# Patient Record
Sex: Female | Born: 1945 | Race: White | Hispanic: No | Marital: Married | Smoking: Former smoker
Health system: Southern US, Community
[De-identification: ages and names within clinical notes are randomized; demographics above are authoritative.]

## PROBLEM LIST (undated history)

## (undated) DIAGNOSIS — F419 Anxiety disorder, unspecified: Secondary | ICD-10-CM

## (undated) DIAGNOSIS — H1010 Acute atopic conjunctivitis, unspecified eye: Secondary | ICD-10-CM

## (undated) DIAGNOSIS — L309 Dermatitis, unspecified: Secondary | ICD-10-CM

## (undated) DIAGNOSIS — D473 Essential (hemorrhagic) thrombocythemia: Secondary | ICD-10-CM

## (undated) HISTORY — DX: Anxiety disorder, unspecified: F41.9

## (undated) HISTORY — DX: Essential (hemorrhagic) thrombocythemia: D47.3

## (undated) HISTORY — DX: Acute atopic conjunctivitis, unspecified eye: H10.10

## (undated) HISTORY — DX: Dermatitis, unspecified: L30.9

---
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
## 2014-11-17 DIAGNOSIS — D473 Essential (hemorrhagic) thrombocythemia: Secondary | ICD-10-CM | POA: Diagnosis not present

## 2014-12-15 DIAGNOSIS — H04123 Dry eye syndrome of bilateral lacrimal glands: Secondary | ICD-10-CM | POA: Diagnosis not present

## 2015-01-03 DIAGNOSIS — F329 Major depressive disorder, single episode, unspecified: Secondary | ICD-10-CM | POA: Diagnosis not present

## 2015-01-03 DIAGNOSIS — Z0000 Encounter for general adult medical examination without abnormal findings: Secondary | ICD-10-CM | POA: Diagnosis not present

## 2015-01-03 DIAGNOSIS — Z23 Encounter for immunization: Secondary | ICD-10-CM | POA: Diagnosis not present

## 2015-01-03 DIAGNOSIS — Z79890 Hormone replacement therapy (postmenopausal): Secondary | ICD-10-CM | POA: Diagnosis not present

## 2015-01-05 DIAGNOSIS — I83811 Varicose veins of right lower extremities with pain: Secondary | ICD-10-CM | POA: Diagnosis not present

## 2015-01-06 DIAGNOSIS — I83812 Varicose veins of left lower extremities with pain: Secondary | ICD-10-CM | POA: Diagnosis not present

## 2015-01-12 DIAGNOSIS — D473 Essential (hemorrhagic) thrombocythemia: Secondary | ICD-10-CM | POA: Diagnosis not present

## 2015-01-25 DIAGNOSIS — Z2889 Immunization not carried out for other reason: Secondary | ICD-10-CM | POA: Diagnosis not present

## 2015-01-25 DIAGNOSIS — L820 Inflamed seborrheic keratosis: Secondary | ICD-10-CM | POA: Diagnosis not present

## 2015-01-25 DIAGNOSIS — D225 Melanocytic nevi of trunk: Secondary | ICD-10-CM | POA: Diagnosis not present

## 2015-02-03 DIAGNOSIS — Z1231 Encounter for screening mammogram for malignant neoplasm of breast: Secondary | ICD-10-CM | POA: Diagnosis not present

## 2015-02-08 DIAGNOSIS — L281 Prurigo nodularis: Secondary | ICD-10-CM | POA: Diagnosis not present

## 2015-02-08 DIAGNOSIS — D485 Neoplasm of uncertain behavior of skin: Secondary | ICD-10-CM | POA: Diagnosis not present

## 2015-02-08 DIAGNOSIS — L859 Epidermal thickening, unspecified: Secondary | ICD-10-CM | POA: Diagnosis not present

## 2015-02-08 DIAGNOSIS — D225 Melanocytic nevi of trunk: Secondary | ICD-10-CM | POA: Diagnosis not present

## 2015-02-24 DIAGNOSIS — L570 Actinic keratosis: Secondary | ICD-10-CM | POA: Diagnosis not present

## 2015-02-24 DIAGNOSIS — D485 Neoplasm of uncertain behavior of skin: Secondary | ICD-10-CM | POA: Diagnosis not present

## 2015-02-24 DIAGNOSIS — D2271 Melanocytic nevi of right lower limb, including hip: Secondary | ICD-10-CM | POA: Diagnosis not present

## 2015-02-24 DIAGNOSIS — L821 Other seborrheic keratosis: Secondary | ICD-10-CM | POA: Diagnosis not present

## 2015-06-06 DIAGNOSIS — I83891 Varicose veins of right lower extremities with other complications: Secondary | ICD-10-CM | POA: Diagnosis not present

## 2015-06-06 DIAGNOSIS — I83892 Varicose veins of left lower extremities with other complications: Secondary | ICD-10-CM | POA: Diagnosis not present

## 2015-06-07 DIAGNOSIS — I83891 Varicose veins of right lower extremities with other complications: Secondary | ICD-10-CM | POA: Diagnosis not present

## 2015-06-07 DIAGNOSIS — I83892 Varicose veins of left lower extremities with other complications: Secondary | ICD-10-CM | POA: Diagnosis not present

## 2015-06-08 DIAGNOSIS — Z23 Encounter for immunization: Secondary | ICD-10-CM | POA: Diagnosis not present

## 2015-06-27 DIAGNOSIS — D473 Essential (hemorrhagic) thrombocythemia: Secondary | ICD-10-CM | POA: Diagnosis not present

## 2015-07-07 DIAGNOSIS — H10413 Chronic giant papillary conjunctivitis, bilateral: Secondary | ICD-10-CM | POA: Diagnosis not present

## 2015-07-26 DIAGNOSIS — Z780 Asymptomatic menopausal state: Secondary | ICD-10-CM | POA: Diagnosis not present

## 2015-07-26 DIAGNOSIS — K635 Polyp of colon: Secondary | ICD-10-CM | POA: Diagnosis not present

## 2015-07-26 DIAGNOSIS — D473 Essential (hemorrhagic) thrombocythemia: Secondary | ICD-10-CM | POA: Diagnosis not present

## 2015-09-27 DIAGNOSIS — R197 Diarrhea, unspecified: Secondary | ICD-10-CM | POA: Diagnosis not present

## 2015-09-27 DIAGNOSIS — R10814 Left lower quadrant abdominal tenderness: Secondary | ICD-10-CM | POA: Diagnosis not present

## 2015-11-18 DIAGNOSIS — H1033 Unspecified acute conjunctivitis, bilateral: Secondary | ICD-10-CM | POA: Diagnosis not present

## 2015-11-18 DIAGNOSIS — H2513 Age-related nuclear cataract, bilateral: Secondary | ICD-10-CM | POA: Diagnosis not present

## 2015-12-06 DIAGNOSIS — L570 Actinic keratosis: Secondary | ICD-10-CM | POA: Diagnosis not present

## 2015-12-06 DIAGNOSIS — H01111 Allergic dermatitis of right upper eyelid: Secondary | ICD-10-CM | POA: Diagnosis not present

## 2015-12-06 DIAGNOSIS — D485 Neoplasm of uncertain behavior of skin: Secondary | ICD-10-CM | POA: Diagnosis not present

## 2015-12-06 DIAGNOSIS — L821 Other seborrheic keratosis: Secondary | ICD-10-CM | POA: Diagnosis not present

## 2015-12-06 DIAGNOSIS — L820 Inflamed seborrheic keratosis: Secondary | ICD-10-CM | POA: Diagnosis not present

## 2015-12-06 DIAGNOSIS — L814 Other melanin hyperpigmentation: Secondary | ICD-10-CM | POA: Diagnosis not present

## 2015-12-06 DIAGNOSIS — D225 Melanocytic nevi of trunk: Secondary | ICD-10-CM | POA: Diagnosis not present

## 2015-12-07 DIAGNOSIS — H1033 Unspecified acute conjunctivitis, bilateral: Secondary | ICD-10-CM | POA: Diagnosis not present

## 2015-12-27 DIAGNOSIS — Z882 Allergy status to sulfonamides status: Secondary | ICD-10-CM | POA: Diagnosis not present

## 2015-12-27 DIAGNOSIS — D473 Essential (hemorrhagic) thrombocythemia: Secondary | ICD-10-CM | POA: Diagnosis not present

## 2015-12-27 DIAGNOSIS — Z79899 Other long term (current) drug therapy: Secondary | ICD-10-CM | POA: Diagnosis not present

## 2015-12-27 DIAGNOSIS — I878 Other specified disorders of veins: Secondary | ICD-10-CM | POA: Diagnosis not present

## 2015-12-27 DIAGNOSIS — I8390 Asymptomatic varicose veins of unspecified lower extremity: Secondary | ICD-10-CM | POA: Diagnosis not present

## 2015-12-27 DIAGNOSIS — Z7982 Long term (current) use of aspirin: Secondary | ICD-10-CM | POA: Diagnosis not present

## 2015-12-27 DIAGNOSIS — Z9289 Personal history of other medical treatment: Secondary | ICD-10-CM | POA: Diagnosis not present

## 2016-01-12 DIAGNOSIS — R748 Abnormal levels of other serum enzymes: Secondary | ICD-10-CM | POA: Diagnosis not present

## 2016-04-17 DIAGNOSIS — Z1231 Encounter for screening mammogram for malignant neoplasm of breast: Secondary | ICD-10-CM | POA: Diagnosis not present

## 2016-05-11 DIAGNOSIS — J069 Acute upper respiratory infection, unspecified: Secondary | ICD-10-CM | POA: Diagnosis not present

## 2016-05-15 DIAGNOSIS — F5101 Primary insomnia: Secondary | ICD-10-CM | POA: Diagnosis not present

## 2016-05-15 DIAGNOSIS — R41840 Attention and concentration deficit: Secondary | ICD-10-CM | POA: Diagnosis not present

## 2016-05-15 DIAGNOSIS — K635 Polyp of colon: Secondary | ICD-10-CM | POA: Diagnosis not present

## 2016-05-15 DIAGNOSIS — Z136 Encounter for screening for cardiovascular disorders: Secondary | ICD-10-CM | POA: Diagnosis not present

## 2016-05-15 DIAGNOSIS — Z780 Asymptomatic menopausal state: Secondary | ICD-10-CM | POA: Diagnosis not present

## 2016-05-15 DIAGNOSIS — Z1159 Encounter for screening for other viral diseases: Secondary | ICD-10-CM | POA: Diagnosis not present

## 2016-05-15 DIAGNOSIS — Z0000 Encounter for general adult medical examination without abnormal findings: Secondary | ICD-10-CM | POA: Diagnosis not present

## 2016-05-15 DIAGNOSIS — Z131 Encounter for screening for diabetes mellitus: Secondary | ICD-10-CM | POA: Diagnosis not present

## 2016-05-15 DIAGNOSIS — E663 Overweight: Secondary | ICD-10-CM | POA: Diagnosis not present

## 2016-05-15 DIAGNOSIS — D473 Essential (hemorrhagic) thrombocythemia: Secondary | ICD-10-CM | POA: Diagnosis not present

## 2016-06-04 DIAGNOSIS — F419 Anxiety disorder, unspecified: Secondary | ICD-10-CM | POA: Diagnosis not present

## 2016-06-04 DIAGNOSIS — Z23 Encounter for immunization: Secondary | ICD-10-CM | POA: Diagnosis not present

## 2016-06-04 DIAGNOSIS — R05 Cough: Secondary | ICD-10-CM | POA: Diagnosis not present

## 2016-06-05 DIAGNOSIS — L814 Other melanin hyperpigmentation: Secondary | ICD-10-CM | POA: Diagnosis not present

## 2016-06-05 DIAGNOSIS — L570 Actinic keratosis: Secondary | ICD-10-CM | POA: Diagnosis not present

## 2016-06-05 DIAGNOSIS — D1801 Hemangioma of skin and subcutaneous tissue: Secondary | ICD-10-CM | POA: Diagnosis not present

## 2016-06-05 DIAGNOSIS — L821 Other seborrheic keratosis: Secondary | ICD-10-CM | POA: Diagnosis not present

## 2016-06-05 DIAGNOSIS — D485 Neoplasm of uncertain behavior of skin: Secondary | ICD-10-CM | POA: Diagnosis not present

## 2016-06-05 DIAGNOSIS — L565 Disseminated superficial actinic porokeratosis (DSAP): Secondary | ICD-10-CM | POA: Diagnosis not present

## 2016-06-05 DIAGNOSIS — D2261 Melanocytic nevi of right upper limb, including shoulder: Secondary | ICD-10-CM | POA: Diagnosis not present

## 2016-06-13 DIAGNOSIS — D473 Essential (hemorrhagic) thrombocythemia: Secondary | ICD-10-CM | POA: Diagnosis not present

## 2016-07-17 DIAGNOSIS — R05 Cough: Secondary | ICD-10-CM | POA: Diagnosis not present

## 2016-07-17 DIAGNOSIS — F411 Generalized anxiety disorder: Secondary | ICD-10-CM | POA: Diagnosis not present

## 2016-07-19 DIAGNOSIS — L570 Actinic keratosis: Secondary | ICD-10-CM | POA: Diagnosis not present

## 2016-08-23 ENCOUNTER — Encounter: Payer: Self-pay | Admitting: Allergy and Immunology

## 2016-08-23 ENCOUNTER — Ambulatory Visit (INDEPENDENT_AMBULATORY_CARE_PROVIDER_SITE_OTHER): Payer: Medicare Other | Admitting: Allergy and Immunology

## 2016-08-23 VITALS — BP 166/82 | HR 96 | Temp 98.4°F | Resp 16 | Ht 66.0 in | Wt 161.2 lb

## 2016-08-23 DIAGNOSIS — R05 Cough: Secondary | ICD-10-CM

## 2016-08-23 DIAGNOSIS — R053 Chronic cough: Secondary | ICD-10-CM | POA: Insufficient documentation

## 2016-08-23 DIAGNOSIS — R030 Elevated blood-pressure reading, without diagnosis of hypertension: Secondary | ICD-10-CM | POA: Diagnosis not present

## 2016-08-23 DIAGNOSIS — H1010 Acute atopic conjunctivitis, unspecified eye: Secondary | ICD-10-CM | POA: Insufficient documentation

## 2016-08-23 DIAGNOSIS — H1013 Acute atopic conjunctivitis, bilateral: Secondary | ICD-10-CM

## 2016-08-23 DIAGNOSIS — J3089 Other allergic rhinitis: Secondary | ICD-10-CM | POA: Diagnosis not present

## 2016-08-23 MED ORDER — AZELASTINE HCL 0.1 % NA SOLN: 1.0000 | Freq: Two times a day (BID) | NASAL | 5 refills | Status: DC | PRN

## 2016-08-23 MED ORDER — FLUTTER DEVI: 0 refills | Status: DC

## 2016-08-23 NOTE — Assessment & Plan Note (Deleted)
   The patient has been made aware of the elevated blood pressure reading and has been encouraged to follow up with her primary care physician in the near future regarding this issue.  Loretta Garrison has verbalized understanding and agreed to do so.

## 2016-08-23 NOTE — Progress Notes (Signed)
New Patient Note  RE: Loretta Garrison MRN: [id number] DOB: 09/23/1946 Date of Office Visit: 08/23/2016  Referring provider: No ref. provider found Primary care provider: Tenna Child, MD  Chief Complaint: Cough and Nasal Congestion   History of present illness: Loretta Garrison is a 70 y.o. female presenting today for evaluation of cough and rhinitis.  She reports that since August 2017 she has experienced nasal congestion, rhinorrhea, postnasal drainage, sneezing, ocular pruritus, maxillary sinus pressure, and coughing.   No significant seasonal symptom variation has been noted nor have specific environmental triggers been identified.She reports coughing has been persistent and frustrating stating that it is "so bad it which openly."  Pneumonia was ruled out and she failed a proton pump inhibitor trial to help rule out silent reflux.  She tried switching from cetirizine to loratadine without perceived benefit.   Assessment and plan: Cough, persistent The patient's history and physical examination suggest upper airway cough syndrome.  Spirometry today reveals normal ventilatory function. We will aggressively treat postnasal drainage and evaluate results.  A prescription has been provided for a flutter valve to be used as needed to break the coughing cycle.  Treatment plan as outlined below.  If the coughing persists or progresses despite this plan, we will evaluate further.  Perennial allergic rhinitis  Aeroallergen avoidance measures have been discussed and provided in written form.  A prescription has been provided for azelastine nasal spray, 1-2 sprays per nostril 2 times daily as needed. Proper nasal spray technique has been discussed and demonstrated.   Nasal saline lavage (NeilMed) has been recommended prior to medicated nasal sprays and as needed along with instructions for proper administration.  For thick post nasal drainage, add guaifenesin 1200 mg (Mucinex Maximum Strength)   twice daily as needed with adequate hydration as discussed.  Discontinue loratadine as this medication may contribute to mucus viscosity.  Allergic conjunctivitis  Treatment plan as outlined above for allergic rhinitis.  A prescription has been provided for Pataday, one drop per eye daily as needed.  Elevated blood-pressure reading without diagnosis of hypertension  The patient has been made aware of the elevated blood pressure reading and has been encouraged to follow up with her primary care physician in the near future regarding this issue.  Loretta Garrison has verbalized understanding and agreed to do so.   Meds ordered this encounter  Medications  . azelastine (ASTELIN) 0.1 % nasal spray    Sig: Place 1-2 sprays into both nostrils 2 (two) times daily as needed for rhinitis.    Dispense:  30 mL    Refill:  5  . Respiratory Therapy Supplies (FLUTTER) DEVI    Sig: Use as directed    Dispense:  1 each    Refill:  0    Diagnostics: Spirometry:  Normal with an FEV1 of 94% predicted.  Please see scanned spirometry results for details. Epicutaneous testing:  Negative despite a positive histamine control. Intradermal testing: Positive to molds and dust mite antigen.    Physical examination: Blood pressure (!) 166/82, pulse 96, temperature 98.4 F (36.9 C), temperature source Oral, resp. rate 16, height 5\' 6"  (1.676 m), weight 161 lb 3.2 oz (73.1 kg).  General: Alert, interactive, in no acute distress. HEENT: TMs pearly gray, turbinates moderately edematous with thick discharge, post-pharynx erythematous. Neck: Supple without lymphadenopathy. Lungs: Clear to auscultation without wheezing, rhonchi or rales. CV: Normal S1, S2 without murmurs. Abdomen: Nondistended, nontender. Skin: Warm and dry, without lesions or rashes. Extremities:  No clubbing,  cyanosis or edema. Neuro:   Grossly intact.  Review of systems:  Review of systems negative except as noted in HPI / PMHx or noted  below: Review of Systems  Constitutional: Negative.   HENT: Negative.   Eyes: Negative.   Respiratory: Negative.   Cardiovascular: Negative.   Gastrointestinal: Negative.   Genitourinary: Negative.   Musculoskeletal: Negative.   Skin: Negative.   Neurological: Negative.   Endo/Heme/Allergies: Negative.   Psychiatric/Behavioral: Negative.     Past medical history:  Past Medical History:  Diagnosis Date  . Allergic conjunctivitis   . Anxiety   . Eczema   . Essential thrombocythemia [facility])     Past surgical history:  Past Surgical History:  Procedure Laterality Date  . TONSILLECTOMY  1958    Family history: Family History  Problem Relation Age of Onset  . Eczema Mother   . Allergic rhinitis Father   . Sinusitis Father   . Eczema Sister   . Asthma Neg Hx   . Immunodeficiency Neg Hx   . Urticaria Neg Hx   . Atopy Neg Hx   . Angioedema Neg Hx     Social history: Social History   Social History  . Marital status: Married    Spouse name: N/A  . Number of children: N/A  . Years of education: N/A   Occupational History  . Not on file.   Social History Main Topics  . Smoking status: Former Smoker    Types: Cigarettes    Quit date: 08/23/1985  . Smokeless tobacco: Never Used  . Alcohol use 0.6 oz/week    1 Shots of liquor per week  . Drug use: No  . Sexual activity: Not on file   Other Topics Concern  . Not on file   Social History Narrative  . No narrative on file   Environmental History: The patient lives in a 68 year old townhouse with carpeting in the bedroom and central air/heat. There are no pets in the townhouse. She is a former cigarette smoker, having smoked from 19 to 1991 with a 12 pack year history.    Medication List       Accurate as of 08/23/16  1:00 PM. Always use your most recent med list.          ALPRAZolam 0.25 MG tablet Commonly known as:  XANAX TAKE 1 TABLET BY MOUTH AT BEDTIME AS NEEDED FOR INSOMNIA   anagrelide 0.5  MG capsule Commonly known as:  AGRYLIN Take by mouth.   azelastine 0.1 % nasal spray Commonly known as:  ASTELIN Place 1-2 sprays into both nostrils 2 (two) times daily as needed for rhinitis.   Biotin 10000 MCG Tabs Take by mouth.   busPIRone 15 MG tablet Commonly known as:  BUSPAR TAKE 1/2 TABLET BY MOUTH TWICE DAILY   CENTRUM SILVER 50+WOMEN PO Take by mouth.   estradiol 1 MG tablet Commonly known as:  ESTRACE Take by mouth.   FISH-FLAX-BORAGE PO Take 1,200 mg by mouth daily.   fluoruracil 1 % cream Commonly known as:  FLUOROPLEX Apply topically.   FLUTTER Devi Use as directed   GOODSENSE ASPIRIN 325 MG tablet Generic drug:  aspirin Take by mouth.   ibuprofen 200 MG tablet Commonly known as:  ADVIL,MOTRIN Take by mouth.   LOTEMAX 0.5 % ophthalmic suspension Generic drug:  loteprednol Apply to eye.   medroxyPROGESTERone 5 MG tablet Commonly known as:  PROVERA Take by mouth.   Olopatadine HCl 0.2 % Soln Administer 1  drop to both eyes daily as needed.   PROBIOTIC DAILY PO Take by mouth.   vitamin C 500 MG tablet Commonly known as:  ASCORBIC ACID Take 500 mg by mouth daily.       Known medication allergies: Allergies  Allergen Reactions  . Sulfa Antibiotics Hives    I appreciate the opportunity to take part in Loretta Garrison's care. Please do not hesitate to contact me with questions.  Sincerely,   R. Edgar Frisk, MD

## 2016-08-23 NOTE — Assessment & Plan Note (Signed)
   Aeroallergen avoidance measures have been discussed and provided in written form.  A prescription has been provided for azelastine nasal spray, 1-2 sprays per nostril 2 times daily as needed. Proper nasal spray technique has been discussed and demonstrated.   Nasal saline lavage (NeilMed) has been recommended prior to medicated nasal sprays and as needed along with instructions for proper administration.  For thick post nasal drainage, add guaifenesin 1200 mg (Mucinex Maximum Strength)  twice daily as needed with adequate hydration as discussed.  Discontinue loratadine as this medication may contribute to mucus viscosity.

## 2016-08-23 NOTE — Assessment & Plan Note (Signed)
   Treatment plan as outlined above for allergic rhinitis.  A prescription has been provided for Pataday, one drop per eye daily as needed. 

## 2016-08-23 NOTE — Patient Instructions (Addendum)
Cough, persistent The patient's history and physical examination suggest upper airway cough syndrome.  Spirometry today reveals normal ventilatory function. We will aggressively treat postnasal drainage and evaluate results.  A prescription has been provided for a flutter valve to be used as needed to break the coughing cycle.  Treatment plan as outlined below.  If the coughing persists or progresses despite this plan, we will evaluate further.  Perennial allergic rhinitis  Aeroallergen avoidance measures have been discussed and provided in written form.  A prescription has been provided for azelastine nasal spray, 1-2 sprays per nostril 2 times daily as needed. Proper nasal spray technique has been discussed and demonstrated.   Nasal saline lavage (NeilMed) has been recommended prior to medicated nasal sprays and as needed along with instructions for proper administration.  For thick post nasal drainage, add guaifenesin 1200 mg (Mucinex Maximum Strength)  twice daily as needed with adequate hydration as discussed.  Discontinue loratadine as this medication may contribute to mucus viscosity.  Allergic conjunctivitis  Treatment plan as outlined above for allergic rhinitis.  A prescription has been provided for Pataday, one drop per eye daily as needed.  Elevated blood-pressure reading without diagnosis of hypertension  The patient has been made aware of the elevated blood pressure reading and has been encouraged to follow up with her primary care physician in the near future regarding this issue.  Marirose has verbalized understanding and agreed to do so.   Return in about 6 weeks (around 10/04/2016), or if symptoms worsen or fail to improve.  Control of House Dust Mite Allergen  House dust mites play a major role in allergic asthma and rhinitis.  They occur in environments with high humidity wherever human skin, the food for dust mites is found. High levels have been detected in dust  obtained from mattresses, pillows, carpets, upholstered furniture, bed covers, clothes and soft toys.  The principal allergen of the house dust mite is found in its feces.  A gram of dust may contain 1,000 mites and 250,000 fecal particles.  Mite antigen is easily measured in the air during house cleaning activities.    1. Encase mattresses, including the box spring, and pillow, in an air tight cover.  Seal the zipper end of the encased mattresses with wide adhesive tape. 2. Wash the bedding in water of 130 degrees Farenheit weekly.  Avoid cotton comforters/quilts and flannel bedding: the most ideal bed covering is the dacron comforter. 3. Remove all upholstered furniture from the bedroom. 4. Remove carpets, carpet padding, rugs, and non-washable window drapes from the bedroom.  Wash drapes weekly or use plastic window coverings. 5. Remove all non-washable stuffed toys from the bedroom.  Wash stuffed toys weekly. 6. Have the room cleaned frequently with a vacuum cleaner and a damp dust-mop.  The patient should not be in a room which is being cleaned and should wait 1 hour after cleaning before going into the room. 7. Close and seal all heating outlets in the bedroom.  Otherwise, the room will become filled with dust-laden air.  An electric heater can be used to heat the room. 8. Reduce indoor humidity to less than 50%.  Do not use a humidifier.  Control of Mold Allergen  Mold and fungi can grow on a variety of surfaces provided certain temperature and moisture conditions exist.  Outdoor molds grow on plants, decaying vegetation and soil.  The major outdoor mold, Alternaria and Cladosporium, are found in very high numbers during hot and dry conditions.  Generally, a late Summer - Fall peak is seen for common outdoor fungal spores.  Rain will temporarily lower outdoor mold spore count, but counts rise rapidly when the rainy period ends.  The most important indoor molds are Aspergillus and Penicillium.   Dark, humid and poorly ventilated basements are ideal sites for mold growth.  The next most common sites of mold growth are the bathroom and the kitchen.  Outdoor [employer] 1. Use air conditioning and keep windows closed 2. Avoid exposure to decaying vegetation. 3. Avoid leaf raking. 4. Avoid grain handling. 5. Consider wearing a face mask if working in moldy areas.  Indoor Mold Control 1. Maintain humidity below 50%. 2. Clean washable surfaces with 5% bleach solution. 3. Remove sources e.g. Contaminated carpets.

## 2016-08-23 NOTE — Assessment & Plan Note (Signed)
   The patient has been made aware of the elevated blood pressure reading and has been encouraged to follow up with her primary care physician in the near future regarding this issue.  Loretta Garrison has verbalized understanding and agreed to do so.

## 2016-08-23 NOTE — Assessment & Plan Note (Signed)
The patient's history and physical examination suggest upper airway cough syndrome.  Spirometry today reveals normal ventilatory function. We will aggressively treat postnasal drainage and evaluate results.  A prescription has been provided for a flutter valve to be used as needed to break the coughing cycle.  Treatment plan as outlined below.  If the coughing persists or progresses despite this plan, we will evaluate further.

## 2016-08-28 ENCOUNTER — Telehealth: Payer: Self-pay

## 2016-08-28 NOTE — Telephone Encounter (Signed)
Spoke to pt. To let her know that the molds can get a little bigger or not absorb into the skin tissue for several days, just apply some hydrocortisone cream to the area. Pt. Stated doing well doing the nasal wash and that is really helping eliminate a lot of the nasal mucus in which pt. Follows with the azelastine.

## 2016-10-04 ENCOUNTER — Encounter: Payer: Self-pay | Admitting: Allergy and Immunology

## 2016-10-04 ENCOUNTER — Ambulatory Visit (INDEPENDENT_AMBULATORY_CARE_PROVIDER_SITE_OTHER): Payer: Medicare Other | Admitting: Allergy and Immunology

## 2016-10-04 VITALS — BP 126/58 | HR 72 | Temp 99.0°F | Resp 24

## 2016-10-04 DIAGNOSIS — R05 Cough: Secondary | ICD-10-CM | POA: Diagnosis not present

## 2016-10-04 DIAGNOSIS — R053 Chronic cough: Secondary | ICD-10-CM

## 2016-10-04 DIAGNOSIS — H1013 Acute atopic conjunctivitis, bilateral: Secondary | ICD-10-CM

## 2016-10-04 DIAGNOSIS — J3089 Other allergic rhinitis: Secondary | ICD-10-CM

## 2016-10-04 NOTE — Assessment & Plan Note (Signed)
Improved.  Continue appropriate allergen avoidance measures, azelastine nasal spray as needed, nasal saline irrigation, and guaifenesin as needed.

## 2016-10-04 NOTE — Progress Notes (Signed)
Follow-up Note  RE: Loretta Garrison MRN: [id number] DOB: 1945/10/19 Date of Office Visit: 10/04/2016  Primary care provider: Tenna Child, MD Referring provider: Tenna Child, MD  History of present illness: Loretta Garrison is a 71 y.o. female with allergic rhinitis and history of persistent cough presenting today for follow up.  She was last seen in this clinic in November 2017.  She reports that in the interval since her previous visit her cough has resolved and her nasal symptoms have improved significantly.  She has no complaints today.   Assessment and plan: Perennial allergic rhinitis Improved.  Continue appropriate allergen avoidance measures, azelastine nasal spray as needed, nasal saline irrigation, and guaifenesin as needed.  Cough, persistent Resolved.  Most likely secondary to upper airway cough syndrome.  Treatment plan as outlined above.   Diagnostics: Spirometry:  Normal with an FEV1 of 87% predicted.  Please see scanned spirometry results for details.    Physical examination: Blood pressure (!) 126/58, pulse 72, temperature 99 F (37.2 C), temperature source Oral, resp. rate (!) 24, SpO2 95 %.  General: Alert, interactive, in no acute distress. HEENT: TMs pearly gray, turbinates minimally edematous without discharge, post-pharynx unremarkable. Neck: Supple without lymphadenopathy. Lungs: Clear to auscultation without wheezing, rhonchi or rales. CV: Normal S1, S2 without murmurs. Skin: Warm and dry, without lesions or rashes.  The following portions of the patient's history were reviewed and updated as appropriate: allergies, current medications, past family history, past medical history, past social history, past surgical history and problem list.  Allergies as of 10/04/2016      Reactions   Sulfa Antibiotics Hives      Medication List       Accurate as of 10/04/16  2:05 PM. Always use your most recent med list.          ALPRAZolam 0.25 MG  tablet Commonly known as:  XANAX TAKE 1 TABLET BY MOUTH AT BEDTIME AS NEEDED FOR INSOMNIA   anagrelide 0.5 MG capsule Commonly known as:  AGRYLIN Take by mouth.   aspirin 325 MG tablet Take by mouth.   azelastine 0.1 % nasal spray Commonly known as:  ASTELIN Place 1-2 sprays into both nostrils 2 (two) times daily as needed for rhinitis.   Biotin 10000 MCG Tabs Take by mouth.   busPIRone 15 MG tablet Commonly known as:  BUSPAR TAKE 1/2 TABLET BY MOUTH TWICE DAILY   CENTRUM SILVER 50+WOMEN PO Take by mouth.   estradiol 1 MG tablet Commonly known as:  ESTRACE Take 1 mg by mouth daily. Takes 1/2 tablet daily   FISH-FLAX-BORAGE PO Take 1,200 mg by mouth daily.   fluoruracil 1 % cream Commonly known as:  FLUOROPLEX Apply topically.   ibuprofen 200 MG tablet Commonly known as:  ADVIL,MOTRIN Take by mouth.   LOTEMAX 0.5 % ophthalmic suspension Generic drug:  loteprednol Apply to eye.   medroxyPROGESTERone 5 MG tablet Commonly known as:  PROVERA Take 5 mg by mouth daily. Take 1/2 tablet daily   metoprolol succinate 25 MG 24 hr tablet Commonly known as:  TOPROL-XL   Olopatadine HCl 0.2 % Soln Administer 1 drop to both eyes daily as needed.   PROBIOTIC DAILY PO Take by mouth.   vitamin C 500 MG tablet Commonly known as:  ASCORBIC ACID Take 500 mg by mouth daily.       Allergies  Allergen Reactions  . Sulfa Antibiotics Hives    I appreciate the opportunity to take part in Loretta Garrison's  care. Please do not hesitate to contact me with questions.  Sincerely,   R. Edgar Frisk, MD

## 2016-10-04 NOTE — Assessment & Plan Note (Signed)
Resolved.  Most likely secondary to upper airway cough syndrome.  Treatment plan as outlined above.

## 2016-10-04 NOTE — Patient Instructions (Signed)
Perennial allergic rhinitis Improved.  Continue appropriate allergen avoidance measures, azelastine nasal spray as needed, nasal saline irrigation, and guaifenesin as needed.  Cough, persistent Resolved.  Most likely secondary to upper airway cough syndrome.  Treatment plan as outlined above.   Return in about 4 months (around 02/01/2017), or if symptoms worsen or fail to improve.

## 2016-10-24 DIAGNOSIS — I10 Essential (primary) hypertension: Secondary | ICD-10-CM | POA: Diagnosis not present

## 2016-10-24 DIAGNOSIS — F5101 Primary insomnia: Secondary | ICD-10-CM | POA: Diagnosis not present

## 2016-11-19 DIAGNOSIS — H524 Presbyopia: Secondary | ICD-10-CM | POA: Diagnosis not present

## 2016-11-19 DIAGNOSIS — H10413 Chronic giant papillary conjunctivitis, bilateral: Secondary | ICD-10-CM | POA: Diagnosis not present

## 2016-12-11 DIAGNOSIS — Z882 Allergy status to sulfonamides status: Secondary | ICD-10-CM | POA: Diagnosis not present

## 2016-12-11 DIAGNOSIS — Z79899 Other long term (current) drug therapy: Secondary | ICD-10-CM | POA: Diagnosis not present

## 2016-12-11 DIAGNOSIS — L814 Other melanin hyperpigmentation: Secondary | ICD-10-CM | POA: Diagnosis not present

## 2016-12-11 DIAGNOSIS — Z7982 Long term (current) use of aspirin: Secondary | ICD-10-CM | POA: Diagnosis not present

## 2016-12-11 DIAGNOSIS — Z87891 Personal history of nicotine dependence: Secondary | ICD-10-CM | POA: Diagnosis not present

## 2016-12-11 DIAGNOSIS — Z85828 Personal history of other malignant neoplasm of skin: Secondary | ICD-10-CM | POA: Diagnosis not present

## 2016-12-11 DIAGNOSIS — D1801 Hemangioma of skin and subcutaneous tissue: Secondary | ICD-10-CM | POA: Diagnosis not present

## 2016-12-11 DIAGNOSIS — I8390 Asymptomatic varicose veins of unspecified lower extremity: Secondary | ICD-10-CM | POA: Diagnosis not present

## 2016-12-11 DIAGNOSIS — D473 Essential (hemorrhagic) thrombocythemia: Secondary | ICD-10-CM | POA: Diagnosis not present

## 2016-12-11 DIAGNOSIS — L570 Actinic keratosis: Secondary | ICD-10-CM | POA: Diagnosis not present

## 2016-12-11 DIAGNOSIS — L821 Other seborrheic keratosis: Secondary | ICD-10-CM | POA: Diagnosis not present

## 2016-12-20 DIAGNOSIS — B351 Tinea unguium: Secondary | ICD-10-CM | POA: Diagnosis not present

## 2016-12-31 ENCOUNTER — Other Ambulatory Visit: Payer: Self-pay

## 2016-12-31 DIAGNOSIS — J3089 Other allergic rhinitis: Secondary | ICD-10-CM

## 2016-12-31 MED ORDER — AZELASTINE HCL 0.1 % NA SOLN: 1.0000 | Freq: Two times a day (BID) | NASAL | 2 refills | Status: AC | PRN

## 2016-12-31 NOTE — Telephone Encounter (Signed)
Patient wants written rx for azelastine 0.1% mailed to her for mail order pharmacy.

## 2017-01-14 DIAGNOSIS — Z87891 Personal history of nicotine dependence: Secondary | ICD-10-CM | POA: Diagnosis not present

## 2017-01-14 DIAGNOSIS — Z85828 Personal history of other malignant neoplasm of skin: Secondary | ICD-10-CM | POA: Diagnosis not present

## 2017-01-14 DIAGNOSIS — Z882 Allergy status to sulfonamides status: Secondary | ICD-10-CM | POA: Diagnosis not present

## 2017-01-14 DIAGNOSIS — R600 Localized edema: Secondary | ICD-10-CM | POA: Diagnosis not present

## 2017-01-14 DIAGNOSIS — I8390 Asymptomatic varicose veins of unspecified lower extremity: Secondary | ICD-10-CM | POA: Diagnosis not present

## 2017-01-14 DIAGNOSIS — Z7982 Long term (current) use of aspirin: Secondary | ICD-10-CM | POA: Diagnosis not present

## 2017-01-14 DIAGNOSIS — D473 Essential (hemorrhagic) thrombocythemia: Secondary | ICD-10-CM | POA: Diagnosis not present

## 2017-02-12 DIAGNOSIS — F5101 Primary insomnia: Secondary | ICD-10-CM | POA: Diagnosis not present

## 2017-02-12 DIAGNOSIS — F419 Anxiety disorder, unspecified: Secondary | ICD-10-CM | POA: Diagnosis not present

## 2017-03-26 DIAGNOSIS — I83893 Varicose veins of bilateral lower extremities with other complications: Secondary | ICD-10-CM | POA: Diagnosis not present

## 2017-03-29 DIAGNOSIS — J439 Emphysema, unspecified: Secondary | ICD-10-CM | POA: Diagnosis not present

## 2017-03-29 DIAGNOSIS — R05 Cough: Secondary | ICD-10-CM | POA: Diagnosis not present

## 2017-04-01 ENCOUNTER — Telehealth: Payer: Self-pay | Admitting: *Deleted

## 2017-04-01 NOTE — Telephone Encounter (Signed)
Spoke to patient and she is no better from the last time we saw her. Please refer to Loretta Garrison's note. I did tell pt. She had a mold/mite allergy. Pt. Did state that she works in a [employer]. I told pt. That we could offer her allergy injection for the mold. I had the pt. Make an appointment with you since we haven't seen her since January.

## 2017-04-01 NOTE — Telephone Encounter (Signed)
Patient is having post nasal drip and cough, one of her doctors ordered a chest xray so she will have the results from that soon. Patient is wondering if we tested her for mold allergies. Patient is also wondering if we can do a blood test for all allergies since her scratch test was so indecisive. Please call patient back to let her know what she can do. Patient said she's still on all medications that doctor Bobbitt prescribed as well as the Mucinex. Patient is still having the same symptoms as when she first came to [place].

## 2017-04-01 NOTE — Telephone Encounter (Signed)
Noted. Thanks.

## 2017-04-03 DIAGNOSIS — L814 Other melanin hyperpigmentation: Secondary | ICD-10-CM | POA: Diagnosis not present

## 2017-04-03 DIAGNOSIS — D1801 Hemangioma of skin and subcutaneous tissue: Secondary | ICD-10-CM | POA: Diagnosis not present

## 2017-04-03 DIAGNOSIS — L821 Other seborrheic keratosis: Secondary | ICD-10-CM | POA: Diagnosis not present

## 2017-04-03 DIAGNOSIS — L820 Inflamed seborrheic keratosis: Secondary | ICD-10-CM | POA: Diagnosis not present

## 2017-04-03 DIAGNOSIS — L570 Actinic keratosis: Secondary | ICD-10-CM | POA: Diagnosis not present

## 2017-04-18 ENCOUNTER — Encounter: Payer: Self-pay | Admitting: Allergy and Immunology

## 2017-04-18 ENCOUNTER — Ambulatory Visit (INDEPENDENT_AMBULATORY_CARE_PROVIDER_SITE_OTHER): Payer: Medicare Other | Admitting: Allergy and Immunology

## 2017-04-18 ENCOUNTER — Telehealth: Payer: Self-pay | Admitting: *Deleted

## 2017-04-18 VITALS — BP 124/60 | HR 73 | Temp 98.6°F | Resp 16

## 2017-04-18 DIAGNOSIS — R430 Anosmia: Secondary | ICD-10-CM | POA: Diagnosis not present

## 2017-04-18 DIAGNOSIS — J3089 Other allergic rhinitis: Secondary | ICD-10-CM

## 2017-04-18 DIAGNOSIS — R05 Cough: Secondary | ICD-10-CM | POA: Diagnosis not present

## 2017-04-18 DIAGNOSIS — R053 Chronic cough: Secondary | ICD-10-CM

## 2017-04-18 MED ORDER — BUDESONIDE 0.5 MG/2ML IN SUSP: RESPIRATORY_TRACT | 5 refills | Status: DC

## 2017-04-18 NOTE — Telephone Encounter (Signed)
Loretta Garrison,   Patient was seen for office visit today by Dr. Verlin Fester. Based upon persistent nasal congestion and now anosmia, a referral is needed to otolaryngology, Dr. Benjamine Mola, per Dr. Verlin Fester.   Thanks, Katherina Right CMA

## 2017-04-18 NOTE — Assessment & Plan Note (Signed)
Spirometry is normal today.  Awaiting results from chest x-ray.

## 2017-04-18 NOTE — Assessment & Plan Note (Signed)
Possible nasal polyps.  Start budesonide/saline nasal irrigation twice a day.  A prescription has been provided for budesonide 0.5 mg respules and instructions for mixing and adminstering the rinse have been discussed and provided in written form.  Based upon persistent nasal congestion and now anosmia, referral has been made to otolaryngology, Dr. Benjamine Mola.

## 2017-04-18 NOTE — Assessment & Plan Note (Signed)
   Continue appropriate allergen avoidance measures.  Start nasal steroid rinses (as above).  For thick post nasal drainage, add guaifenesin [phone] mg (Mucinex)  twice daily as needed with adequate hydration as discussed.

## 2017-04-18 NOTE — Progress Notes (Signed)
Follow-up Note  RE: Jadelin Eng MRN: [id number] DOB: [birth year] Date of Office Visit: 04/18/2017  Primary care provider: Tenna Child, MD Referring provider: Tenna Child, MD  History of present illness: Loretta Garrison is a 71 y.o. female with allergic rhinitis and history of persistent cough presents today for follow up.  She was last seen in this clinic on 10/04/2016.  She reports that over the past 3 weeks she has lost her sense of smell.  She continues to complain of persistent nasal congestion.  She has not noticed any benefit from azelastine nasal spray.  She still experiences a nonproductive cough.  She believes that the cough may be coming from her chest, but is uncertain if it is related to thick postnasal drainage.  She recently had a chest x-ray done but has not received the results from that study yet.  I am unable to a and has results on my electronic medical records, Epic.  She reports that she has tried a therapeutic trial with proton pump inhibitor but there is no perceived benefit after one month of taking the PPI so she stopped.   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  Start budesonide/saline nasal irrigation twice a day.  A prescription has been provided for budesonide 0.5 mg respules and instructions for mixing and adminstering the rinse have been discussed and provided in written form.  Based upon persistent nasal congestion and now anosmia, referral has been made to otolaryngology, Dr. Benjamine Mola.  Perennial allergic rhinitis  Continue appropriate allergen avoidance measures.  Start nasal steroid rinses (as above).  For thick post nasal drainage, add guaifenesin [phone] mg (Mucinex)  twice daily as needed with adequate hydration as discussed.  Cough, persistent Spirometry is normal today.  Awaiting results from chest x-ray.   Meds ordered this encounter  Medications  . budesonide (PULMICORT) 0.5 MG/2ML nebulizer solution    Sig: Add  entire content of ampule to 240 cc of saline in [facility] Bottle and irrigate sinuses twice a day as directed.    Dispense:  120 mL    Refill:  5    Diagnostics: Spirometry:  Normal with an FEV1 of 89% predicted.  Please see scanned spirometry results for details.    Physical examination: Blood pressure 124/60, pulse 73, temperature 98.6 F (37 C), temperature source Oral, resp. rate 16, SpO2 94 %.  General: Alert, interactive, in no acute distress. HEENT: TMs pearly gray, turbinates edematous without discharge, post-pharynx mildly erythematous. Neck: Supple without lymphadenopathy. Lungs: Clear to auscultation without wheezing, rhonchi or rales. CV: Normal S1, S2 without murmurs. Skin: Warm and dry, without lesions or rashes.  The following portions of the patient's history were reviewed and updated as appropriate: allergies, current medications, past family history, past medical history, past social history, past surgical history and problem list.  Allergies as of 04/18/2017      Reactions   Sulfa Antibiotics Hives      Medication List       Accurate as of 04/18/17  5:53 PM. Always use your most recent med list.          anagrelide 0.5 MG capsule Commonly known as:  AGRYLIN Take 0.5 mg by mouth 3 (three) times daily.   aspirin 325 MG tablet Take by mouth.   azelastine 0.1 % nasal spray Commonly known as:  ASTELIN Place 1-2 sprays into both nostrils 2 (two) times daily as needed for rhinitis.   Biotin 10000 MCG Tabs Take by mouth.  budesonide 0.5 MG/2ML nebulizer solution Commonly known as:  PULMICORT Add entire content of ampule to 240 cc of saline in [facility] Bottle and irrigate sinuses twice a day as directed.   busPIRone 15 MG tablet Commonly known as:  BUSPAR TAKE 10 mg TABLET BY MOUTH TWICE DAILY   CENTRUM SILVER 50+WOMEN PO Take by mouth.   FISH-FLAX-BORAGE PO Take 1,200 mg by mouth daily.   fluoruracil 1 % cream Commonly known as:   FLUOROPLEX Apply topically.   fluoruracil 1 % cream Commonly known as:  FLUOROPLEX Apply topically.   ibuprofen 200 MG tablet Commonly known as:  ADVIL,MOTRIN Take by mouth.   metoprolol succinate 25 MG 24 hr tablet Commonly known as:  TOPROL-XL   Olopatadine HCl 0.2 % Soln Administer 1 drop to both eyes daily as needed.   PROBIOTIC DAILY PO Take by mouth.   traZODone 50 MG tablet Commonly known as:  DESYREL Take 50 mg by mouth daily.       Allergies  Allergen Reactions  . Sulfa Antibiotics Hives   Review of systems: Review of systems negative except as noted in HPI / PMHx or noted below: Constitutional: Negative.  HENT: Negative.   Eyes: Negative.  Respiratory: Negative.   Cardiovascular: Negative.  Gastrointestinal: Negative.  Genitourinary: Negative.  Musculoskeletal: Negative.  Neurological: Negative.  Endo/Heme/Allergies: Negative.  Cutaneous: Negative.  Past Medical History:  Diagnosis Date  . Allergic conjunctivitis   . Anxiety   . Eczema   . Essential thrombocythemia ([place])     Family History  Problem Relation Age of Onset  . Eczema Mother   . Allergic rhinitis Father   . Sinusitis Father   . Eczema Sister   . Asthma Neg Hx   . Immunodeficiency Neg Hx   . Urticaria Neg Hx   . Atopy Neg Hx   . Angioedema Neg Hx     Social History   Social History  . Marital status: Married    Spouse name: N/A  . Number of children: N/A  . Years of education: N/A   Occupational History  . Not on file.   Social History Main Topics  . Smoking status: Former Smoker    Types: Cigarettes    Quit date: 08/23/1985  . Smokeless tobacco: Never Used  . Alcohol use 0.6 oz/week    1 Shots of liquor per week  . Drug use: No  . Sexual activity: Not on file   Other Topics Concern  . Not on file   Social History Narrative  . No narrative on file    I appreciate the opportunity to take part in Sherral's care. Please do not hesitate to contact me with  questions.  Sincerely,   R. Edgar Frisk, MD

## 2017-04-18 NOTE — Patient Instructions (Addendum)
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  Start budesonide/saline nasal irrigation twice a day.  A prescription has been provided for budesonide 0.5 mg respules and instructions for mixing and adminstering the rinse have been discussed and provided in written form.  Based upon persistent nasal congestion and now anosmia, referral has been made to otolaryngology, Dr. Benjamine Mola.  Perennial allergic rhinitis  Continue appropriate allergen avoidance measures.  Start nasal steroid rinses (as above).  For thick post nasal drainage, add guaifenesin [phone] mg (Mucinex)  twice daily as needed with adequate hydration as discussed.  Cough, persistent Spirometry is normal today.  Awaiting results from chest x-ray.   Return in about 6 months (around 10/19/2017), or if symptoms worsen or fail to improve.   Budesonide (Pulmicort) + Saline Irrigation/Rinse  Budesonide (Pulmicort) is an anti-inflammatory steroid medication used to decrease nasal and sinus inflammation. It is dispensed in liquid form in a vial. Although it is manufactured for use with a nebulizer, we intend for you to use it with the [employer] Sinus Rinse bottle (preferred) or a Neti pot.   Instructions:  1) Make 240cc of saline in the [employer] bottle using the salt packets or your own saline recipe (see separate handout).  2) Add the entire 2cc vial of liquid Budesonide (Pulmicort) to the rinse bottle and mix together.  3) While in the shower or over the sink, tilt your head forward to a comfortable level. Put the tip of the sinus rinse bottle in your nostril and aim it towards the crown or top of your head. Gently squeeze the bottle to flush out your nose. The fluid will circulate in and out of your sinus cavities, coming back out from either nostril or through your mouth. Try not to swallow large quantities and spit it out instead.  4) Perform Budesonide (Pulmicort) + Saline irrigations 2 times daily.

## 2017-04-18 NOTE — Telephone Encounter (Signed)
Also forgot to mention that patient will be out of town August 11-21 st.

## 2017-04-19 ENCOUNTER — Telehealth: Payer: Self-pay

## 2017-04-19 NOTE — Telephone Encounter (Signed)
pts insurance would not cover budesonide. I completed a PA and it was denied. What would you like to switch pt too?  Please advise

## 2017-04-22 ENCOUNTER — Other Ambulatory Visit: Payer: Self-pay

## 2017-04-22 MED ORDER — FLUTICASONE PROPIONATE 50 MCG/ACT NA SUSP: 1.0000 | Freq: Every day | NASAL | 5 refills | Status: AC

## 2017-04-22 NOTE — Telephone Encounter (Signed)
Pt stated that she had a several azelastine nasal sprays at her home left over. Can she just use those up before the fluticasone is started?  Please advise

## 2017-04-22 NOTE — Telephone Encounter (Signed)
I guess she will have to use fluticasone nasal spray at least once daily. Have her use nasal saline irrigation first. In addition she was referred to see Dr. Benjamine Mola.

## 2017-04-22 NOTE — Telephone Encounter (Signed)
Spoke with pt and informed her of what dr bobbitt has stated

## 2017-04-22 NOTE — Telephone Encounter (Signed)
Lm for pt to call us back  

## 2017-04-22 NOTE — Telephone Encounter (Signed)
Sent in fluticasone and will inform pt

## 2017-04-22 NOTE — Telephone Encounter (Signed)
No, because we suspect nasal polyps she needs a nasal steroid not a nasal antihistamine.

## 2017-04-23 ENCOUNTER — Telehealth: Payer: Self-pay

## 2017-04-23 NOTE — Telephone Encounter (Signed)
Patient referral faxed to Dr. Benjamine Mola, will follow back up in a few days to make sure patient was scheduled.   Thanks

## 2017-04-23 NOTE — Telephone Encounter (Signed)
-----   Message from Adelina Mings, MD sent at 04/18/2017 12:46 PM EDT ----- Please put in a referral to Dr. Benjamine Mola for anosmia and chronic nasal congestion and persistent cough. Thanks.

## 2017-04-23 NOTE — Telephone Encounter (Signed)
Noted. Thanks.

## 2017-04-23 NOTE — Telephone Encounter (Signed)
Referral faxed to Dr Benjamine Mola Thanks.

## 2017-04-25 DIAGNOSIS — Z1231 Encounter for screening mammogram for malignant neoplasm of breast: Secondary | ICD-10-CM | POA: Diagnosis not present

## 2017-04-26 DIAGNOSIS — Z79899 Other long term (current) drug therapy: Secondary | ICD-10-CM | POA: Diagnosis not present

## 2017-04-26 DIAGNOSIS — D473 Essential (hemorrhagic) thrombocythemia: Secondary | ICD-10-CM | POA: Diagnosis not present

## 2017-04-26 DIAGNOSIS — Z7982 Long term (current) use of aspirin: Secondary | ICD-10-CM | POA: Diagnosis not present

## 2017-04-26 DIAGNOSIS — Z9289 Personal history of other medical treatment: Secondary | ICD-10-CM | POA: Diagnosis not present

## 2017-04-26 DIAGNOSIS — I8390 Asymptomatic varicose veins of unspecified lower extremity: Secondary | ICD-10-CM | POA: Diagnosis not present

## 2017-05-15 ENCOUNTER — Other Ambulatory Visit: Payer: Self-pay | Admitting: Allergy

## 2017-05-15 ENCOUNTER — Telehealth: Payer: Self-pay | Admitting: Allergy

## 2017-05-15 MED ORDER — BUDESONIDE 0.5 MG/2ML IN SUSP: 0.5000 mg | Freq: Two times a day (BID) | RESPIRATORY_TRACT | 6 refills | Status: AC

## 2017-05-15 NOTE — Telephone Encounter (Signed)
Dr.Bobbitt insurance is  not covering Budesonide 0.5 mg/65ml.they say that the requested medication is being administered via an infusion pump or oral inhalation devise in the patient's home.

## 2017-05-15 NOTE — Telephone Encounter (Signed)
Informed Charlita of Dr. Barnett Hatter note. She understood. Faxed in prescription.

## 2017-05-15 NOTE — Telephone Encounter (Signed)
Please resubmit script with instructions to take inhaled via nebulizer twice a day.  (Call the patient a letter note that she is indeed to use this medication however through the needle med bottle).  If the diagnostic code is required, put persistent cough.

## 2017-05-17 DIAGNOSIS — Z780 Asymptomatic menopausal state: Secondary | ICD-10-CM | POA: Diagnosis not present

## 2017-05-17 DIAGNOSIS — F5101 Primary insomnia: Secondary | ICD-10-CM | POA: Diagnosis not present

## 2017-05-17 DIAGNOSIS — F419 Anxiety disorder, unspecified: Secondary | ICD-10-CM | POA: Diagnosis not present

## 2017-05-17 DIAGNOSIS — K635 Polyp of colon: Secondary | ICD-10-CM | POA: Diagnosis not present

## 2017-05-17 DIAGNOSIS — R05 Cough: Secondary | ICD-10-CM | POA: Diagnosis not present

## 2017-05-17 DIAGNOSIS — I10 Essential (primary) hypertension: Secondary | ICD-10-CM | POA: Diagnosis not present

## 2017-05-17 DIAGNOSIS — Z23 Encounter for immunization: Secondary | ICD-10-CM | POA: Diagnosis not present

## 2017-05-17 DIAGNOSIS — D473 Essential (hemorrhagic) thrombocythemia: Secondary | ICD-10-CM | POA: Diagnosis not present

## 2017-05-17 DIAGNOSIS — E786 Lipoprotein deficiency: Secondary | ICD-10-CM | POA: Diagnosis not present

## 2017-05-17 DIAGNOSIS — R232 Flushing: Secondary | ICD-10-CM | POA: Diagnosis not present

## 2017-05-17 DIAGNOSIS — Z0000 Encounter for general adult medical examination without abnormal findings: Secondary | ICD-10-CM | POA: Diagnosis not present

## 2017-06-03 DIAGNOSIS — D473 Essential (hemorrhagic) thrombocythemia: Secondary | ICD-10-CM | POA: Diagnosis not present

## 2017-06-04 DIAGNOSIS — D473 Essential (hemorrhagic) thrombocythemia: Secondary | ICD-10-CM | POA: Diagnosis not present

## 2017-06-04 DIAGNOSIS — R509 Fever, unspecified: Secondary | ICD-10-CM | POA: Diagnosis not present

## 2017-06-24 DIAGNOSIS — M791 Myalgia, unspecified site: Secondary | ICD-10-CM | POA: Diagnosis not present

## 2017-06-24 DIAGNOSIS — N950 Postmenopausal bleeding: Secondary | ICD-10-CM | POA: Diagnosis not present

## 2017-06-24 DIAGNOSIS — Z780 Asymptomatic menopausal state: Secondary | ICD-10-CM | POA: Diagnosis not present

## 2017-06-25 DIAGNOSIS — D473 Essential (hemorrhagic) thrombocythemia: Secondary | ICD-10-CM | POA: Diagnosis not present

## 2017-06-26 ENCOUNTER — Other Ambulatory Visit: Payer: Self-pay | Admitting: Family Medicine

## 2017-06-26 DIAGNOSIS — N950 Postmenopausal bleeding: Secondary | ICD-10-CM

## 2017-06-27 DIAGNOSIS — Z79899 Other long term (current) drug therapy: Secondary | ICD-10-CM | POA: Diagnosis not present

## 2017-06-27 DIAGNOSIS — D473 Essential (hemorrhagic) thrombocythemia: Secondary | ICD-10-CM | POA: Diagnosis not present

## 2017-06-28 ENCOUNTER — Ambulatory Visit
Admission: RE | Admit: 2017-06-28 | Discharge: 2017-06-28 | Disposition: A | Discharge status: Home / Self Care | Payer: Medicare Other | Source: Ambulatory Visit | Attending: Family Medicine | Admitting: Family Medicine

## 2017-06-28 DIAGNOSIS — N950 Postmenopausal bleeding: Secondary | ICD-10-CM

## 2017-06-28 DIAGNOSIS — D259 Leiomyoma of uterus, unspecified: Secondary | ICD-10-CM | POA: Diagnosis not present

## 2017-07-02 DIAGNOSIS — D473 Essential (hemorrhagic) thrombocythemia: Secondary | ICD-10-CM | POA: Diagnosis not present

## 2017-07-09 DIAGNOSIS — R634 Abnormal weight loss: Secondary | ICD-10-CM | POA: Diagnosis not present

## 2017-07-09 DIAGNOSIS — Z79899 Other long term (current) drug therapy: Secondary | ICD-10-CM | POA: Diagnosis not present

## 2017-07-09 DIAGNOSIS — F419 Anxiety disorder, unspecified: Secondary | ICD-10-CM | POA: Diagnosis not present

## 2017-07-09 DIAGNOSIS — F5101 Primary insomnia: Secondary | ICD-10-CM | POA: Diagnosis not present

## 2017-07-09 DIAGNOSIS — D473 Essential (hemorrhagic) thrombocythemia: Secondary | ICD-10-CM | POA: Diagnosis not present

## 2017-07-16 DIAGNOSIS — D473 Essential (hemorrhagic) thrombocythemia: Secondary | ICD-10-CM | POA: Diagnosis not present

## 2017-07-23 DIAGNOSIS — D473 Essential (hemorrhagic) thrombocythemia: Secondary | ICD-10-CM | POA: Diagnosis not present

## 2017-07-30 DIAGNOSIS — D473 Essential (hemorrhagic) thrombocythemia: Secondary | ICD-10-CM | POA: Diagnosis not present

## 2017-08-06 DIAGNOSIS — Z79899 Other long term (current) drug therapy: Secondary | ICD-10-CM | POA: Diagnosis not present

## 2017-08-06 DIAGNOSIS — I8390 Asymptomatic varicose veins of unspecified lower extremity: Secondary | ICD-10-CM | POA: Diagnosis not present

## 2017-08-06 DIAGNOSIS — D473 Essential (hemorrhagic) thrombocythemia: Secondary | ICD-10-CM | POA: Diagnosis not present

## 2017-08-06 DIAGNOSIS — Z85828 Personal history of other malignant neoplasm of skin: Secondary | ICD-10-CM | POA: Diagnosis not present

## 2017-08-09 DIAGNOSIS — I700 Atherosclerosis of aorta: Secondary | ICD-10-CM | POA: Diagnosis not present

## 2017-08-09 DIAGNOSIS — M549 Dorsalgia, unspecified: Secondary | ICD-10-CM | POA: Diagnosis not present

## 2017-08-20 DIAGNOSIS — D473 Essential (hemorrhagic) thrombocythemia: Secondary | ICD-10-CM | POA: Diagnosis not present

## 2017-09-04 DIAGNOSIS — D473 Essential (hemorrhagic) thrombocythemia: Secondary | ICD-10-CM | POA: Diagnosis not present

## 2017-09-05 DIAGNOSIS — M545 Low back pain: Secondary | ICD-10-CM | POA: Diagnosis not present

## 2017-09-08 DIAGNOSIS — M48061 Spinal stenosis, lumbar region without neurogenic claudication: Secondary | ICD-10-CM | POA: Diagnosis not present

## 2017-09-08 DIAGNOSIS — M545 Low back pain: Secondary | ICD-10-CM | POA: Diagnosis not present

## 2017-09-08 DIAGNOSIS — M5127 Other intervertebral disc displacement, lumbosacral region: Secondary | ICD-10-CM | POA: Diagnosis not present

## 2017-09-11 DIAGNOSIS — M545 Low back pain: Secondary | ICD-10-CM | POA: Diagnosis not present

## 2017-09-13 DIAGNOSIS — M5442 Lumbago with sciatica, left side: Secondary | ICD-10-CM | POA: Diagnosis not present

## 2017-09-13 DIAGNOSIS — M545 Low back pain: Secondary | ICD-10-CM | POA: Diagnosis not present

## 2017-09-13 DIAGNOSIS — M4807 Spinal stenosis, lumbosacral region: Secondary | ICD-10-CM | POA: Diagnosis not present

## 2017-09-16 DIAGNOSIS — M4807 Spinal stenosis, lumbosacral region: Secondary | ICD-10-CM | POA: Diagnosis not present

## 2017-09-16 DIAGNOSIS — M5442 Lumbago with sciatica, left side: Secondary | ICD-10-CM | POA: Diagnosis not present

## 2017-09-16 DIAGNOSIS — M545 Low back pain: Secondary | ICD-10-CM | POA: Diagnosis not present

## 2017-09-16 DIAGNOSIS — D473 Essential (hemorrhagic) thrombocythemia: Secondary | ICD-10-CM | POA: Diagnosis not present

## 2017-09-19 DIAGNOSIS — M4807 Spinal stenosis, lumbosacral region: Secondary | ICD-10-CM | POA: Diagnosis not present

## 2017-09-19 DIAGNOSIS — M5442 Lumbago with sciatica, left side: Secondary | ICD-10-CM | POA: Diagnosis not present

## 2017-09-19 DIAGNOSIS — M545 Low back pain: Secondary | ICD-10-CM | POA: Diagnosis not present

## 2017-09-23 DIAGNOSIS — M545 Low back pain: Secondary | ICD-10-CM | POA: Diagnosis not present

## 2017-09-23 DIAGNOSIS — M5442 Lumbago with sciatica, left side: Secondary | ICD-10-CM | POA: Diagnosis not present

## 2017-09-23 DIAGNOSIS — M4807 Spinal stenosis, lumbosacral region: Secondary | ICD-10-CM | POA: Diagnosis not present

## 2017-09-26 DIAGNOSIS — M545 Low back pain: Secondary | ICD-10-CM | POA: Diagnosis not present

## 2017-09-26 DIAGNOSIS — M4807 Spinal stenosis, lumbosacral region: Secondary | ICD-10-CM | POA: Diagnosis not present

## 2017-09-26 DIAGNOSIS — M5442 Lumbago with sciatica, left side: Secondary | ICD-10-CM | POA: Diagnosis not present

## 2017-09-30 DIAGNOSIS — M4807 Spinal stenosis, lumbosacral region: Secondary | ICD-10-CM | POA: Diagnosis not present

## 2017-09-30 DIAGNOSIS — M545 Low back pain: Secondary | ICD-10-CM | POA: Diagnosis not present

## 2017-09-30 DIAGNOSIS — M5442 Lumbago with sciatica, left side: Secondary | ICD-10-CM | POA: Diagnosis not present

## 2017-10-07 DIAGNOSIS — M5442 Lumbago with sciatica, left side: Secondary | ICD-10-CM | POA: Diagnosis not present

## 2017-10-07 DIAGNOSIS — M545 Low back pain: Secondary | ICD-10-CM | POA: Diagnosis not present

## 2017-10-07 DIAGNOSIS — M4807 Spinal stenosis, lumbosacral region: Secondary | ICD-10-CM | POA: Diagnosis not present

## 2017-10-10 DIAGNOSIS — M545 Low back pain: Secondary | ICD-10-CM | POA: Diagnosis not present

## 2017-10-10 DIAGNOSIS — M5442 Lumbago with sciatica, left side: Secondary | ICD-10-CM | POA: Diagnosis not present

## 2017-10-10 DIAGNOSIS — M4807 Spinal stenosis, lumbosacral region: Secondary | ICD-10-CM | POA: Diagnosis not present

## 2017-10-10 DIAGNOSIS — M47816 Spondylosis without myelopathy or radiculopathy, lumbar region: Secondary | ICD-10-CM | POA: Diagnosis not present

## 2017-10-10 DIAGNOSIS — M48061 Spinal stenosis, lumbar region without neurogenic claudication: Secondary | ICD-10-CM | POA: Diagnosis not present

## 2017-10-14 DIAGNOSIS — M4807 Spinal stenosis, lumbosacral region: Secondary | ICD-10-CM | POA: Diagnosis not present

## 2017-10-14 DIAGNOSIS — M545 Low back pain: Secondary | ICD-10-CM | POA: Diagnosis not present

## 2017-10-14 DIAGNOSIS — M5442 Lumbago with sciatica, left side: Secondary | ICD-10-CM | POA: Diagnosis not present

## 2017-10-23 DIAGNOSIS — M4807 Spinal stenosis, lumbosacral region: Secondary | ICD-10-CM | POA: Diagnosis not present

## 2017-10-23 DIAGNOSIS — M545 Low back pain: Secondary | ICD-10-CM | POA: Diagnosis not present

## 2017-10-23 DIAGNOSIS — M5442 Lumbago with sciatica, left side: Secondary | ICD-10-CM | POA: Diagnosis not present

## 2017-10-25 DIAGNOSIS — M4807 Spinal stenosis, lumbosacral region: Secondary | ICD-10-CM | POA: Diagnosis not present

## 2017-10-25 DIAGNOSIS — M5442 Lumbago with sciatica, left side: Secondary | ICD-10-CM | POA: Diagnosis not present

## 2017-10-25 DIAGNOSIS — M545 Low back pain: Secondary | ICD-10-CM | POA: Diagnosis not present

## 2017-10-29 DIAGNOSIS — M5442 Lumbago with sciatica, left side: Secondary | ICD-10-CM | POA: Diagnosis not present

## 2017-10-29 DIAGNOSIS — M4807 Spinal stenosis, lumbosacral region: Secondary | ICD-10-CM | POA: Diagnosis not present

## 2017-10-29 DIAGNOSIS — M545 Low back pain: Secondary | ICD-10-CM | POA: Diagnosis not present

## 2017-10-31 DIAGNOSIS — M4807 Spinal stenosis, lumbosacral region: Secondary | ICD-10-CM | POA: Diagnosis not present

## 2017-10-31 DIAGNOSIS — M545 Low back pain: Secondary | ICD-10-CM | POA: Diagnosis not present

## 2017-10-31 DIAGNOSIS — M5442 Lumbago with sciatica, left side: Secondary | ICD-10-CM | POA: Diagnosis not present

## 2017-11-04 DIAGNOSIS — Z111 Encounter for screening for respiratory tuberculosis: Secondary | ICD-10-CM | POA: Diagnosis not present

## 2017-11-05 DIAGNOSIS — M4807 Spinal stenosis, lumbosacral region: Secondary | ICD-10-CM | POA: Diagnosis not present

## 2017-11-05 DIAGNOSIS — M5442 Lumbago with sciatica, left side: Secondary | ICD-10-CM | POA: Diagnosis not present

## 2017-11-05 DIAGNOSIS — M545 Low back pain: Secondary | ICD-10-CM | POA: Diagnosis not present

## 2017-11-07 DIAGNOSIS — M4807 Spinal stenosis, lumbosacral region: Secondary | ICD-10-CM | POA: Diagnosis not present

## 2017-11-07 DIAGNOSIS — M545 Low back pain: Secondary | ICD-10-CM | POA: Diagnosis not present

## 2017-11-07 DIAGNOSIS — M5442 Lumbago with sciatica, left side: Secondary | ICD-10-CM | POA: Diagnosis not present

## 2017-11-12 DIAGNOSIS — M5442 Lumbago with sciatica, left side: Secondary | ICD-10-CM | POA: Diagnosis not present

## 2017-11-12 DIAGNOSIS — M545 Low back pain: Secondary | ICD-10-CM | POA: Diagnosis not present

## 2017-11-12 DIAGNOSIS — M4807 Spinal stenosis, lumbosacral region: Secondary | ICD-10-CM | POA: Diagnosis not present

## 2017-11-14 DIAGNOSIS — M5442 Lumbago with sciatica, left side: Secondary | ICD-10-CM | POA: Diagnosis not present

## 2017-11-14 DIAGNOSIS — M4807 Spinal stenosis, lumbosacral region: Secondary | ICD-10-CM | POA: Diagnosis not present

## 2017-11-14 DIAGNOSIS — M545 Low back pain: Secondary | ICD-10-CM | POA: Diagnosis not present

## 2017-11-18 DIAGNOSIS — F419 Anxiety disorder, unspecified: Secondary | ICD-10-CM | POA: Diagnosis not present

## 2017-11-18 DIAGNOSIS — M4807 Spinal stenosis, lumbosacral region: Secondary | ICD-10-CM | POA: Diagnosis not present

## 2017-11-18 DIAGNOSIS — F5101 Primary insomnia: Secondary | ICD-10-CM | POA: Diagnosis not present

## 2017-11-18 DIAGNOSIS — M545 Low back pain: Secondary | ICD-10-CM | POA: Diagnosis not present

## 2017-11-18 DIAGNOSIS — I10 Essential (primary) hypertension: Secondary | ICD-10-CM | POA: Diagnosis not present

## 2017-11-18 DIAGNOSIS — M5442 Lumbago with sciatica, left side: Secondary | ICD-10-CM | POA: Diagnosis not present

## 2017-11-21 DIAGNOSIS — H2513 Age-related nuclear cataract, bilateral: Secondary | ICD-10-CM | POA: Diagnosis not present

## 2017-11-21 DIAGNOSIS — M4807 Spinal stenosis, lumbosacral region: Secondary | ICD-10-CM | POA: Diagnosis not present

## 2017-11-21 DIAGNOSIS — H35363 Drusen (degenerative) of macula, bilateral: Secondary | ICD-10-CM | POA: Diagnosis not present

## 2017-11-21 DIAGNOSIS — H524 Presbyopia: Secondary | ICD-10-CM | POA: Diagnosis not present

## 2017-11-21 DIAGNOSIS — M5442 Lumbago with sciatica, left side: Secondary | ICD-10-CM | POA: Diagnosis not present

## 2017-11-21 DIAGNOSIS — H04123 Dry eye syndrome of bilateral lacrimal glands: Secondary | ICD-10-CM | POA: Diagnosis not present

## 2017-11-21 DIAGNOSIS — H5213 Myopia, bilateral: Secondary | ICD-10-CM | POA: Diagnosis not present

## 2017-11-21 DIAGNOSIS — H52223 Regular astigmatism, bilateral: Secondary | ICD-10-CM | POA: Diagnosis not present

## 2017-11-21 DIAGNOSIS — H43813 Vitreous degeneration, bilateral: Secondary | ICD-10-CM | POA: Diagnosis not present

## 2017-11-21 DIAGNOSIS — M545 Low back pain: Secondary | ICD-10-CM | POA: Diagnosis not present

## 2017-11-26 DIAGNOSIS — M4807 Spinal stenosis, lumbosacral region: Secondary | ICD-10-CM | POA: Diagnosis not present

## 2017-11-26 DIAGNOSIS — M5442 Lumbago with sciatica, left side: Secondary | ICD-10-CM | POA: Diagnosis not present

## 2017-11-26 DIAGNOSIS — M545 Low back pain: Secondary | ICD-10-CM | POA: Diagnosis not present

## 2017-12-04 DIAGNOSIS — M47816 Spondylosis without myelopathy or radiculopathy, lumbar region: Secondary | ICD-10-CM | POA: Diagnosis not present

## 2017-12-25 DIAGNOSIS — D473 Essential (hemorrhagic) thrombocythemia: Secondary | ICD-10-CM | POA: Diagnosis not present

## 2017-12-26 DIAGNOSIS — Z85828 Personal history of other malignant neoplasm of skin: Secondary | ICD-10-CM | POA: Diagnosis not present

## 2017-12-26 DIAGNOSIS — Z79899 Other long term (current) drug therapy: Secondary | ICD-10-CM | POA: Diagnosis not present

## 2017-12-26 DIAGNOSIS — D473 Essential (hemorrhagic) thrombocythemia: Secondary | ICD-10-CM | POA: Diagnosis not present

## 2017-12-26 DIAGNOSIS — Z87891 Personal history of nicotine dependence: Secondary | ICD-10-CM | POA: Diagnosis not present

## 2017-12-26 DIAGNOSIS — Z9089 Acquired absence of other organs: Secondary | ICD-10-CM | POA: Diagnosis not present

## 2017-12-26 DIAGNOSIS — I8390 Asymptomatic varicose veins of unspecified lower extremity: Secondary | ICD-10-CM | POA: Diagnosis not present

## 2018-04-10 DIAGNOSIS — I8390 Asymptomatic varicose veins of unspecified lower extremity: Secondary | ICD-10-CM | POA: Diagnosis not present

## 2018-04-10 DIAGNOSIS — Z87891 Personal history of nicotine dependence: Secondary | ICD-10-CM | POA: Diagnosis not present

## 2018-04-10 DIAGNOSIS — R430 Anosmia: Secondary | ICD-10-CM | POA: Diagnosis not present

## 2018-04-10 DIAGNOSIS — Z85828 Personal history of other malignant neoplasm of skin: Secondary | ICD-10-CM | POA: Diagnosis not present

## 2018-04-10 DIAGNOSIS — Z9089 Acquired absence of other organs: Secondary | ICD-10-CM | POA: Diagnosis not present

## 2018-04-10 DIAGNOSIS — D473 Essential (hemorrhagic) thrombocythemia: Secondary | ICD-10-CM | POA: Diagnosis not present

## 2018-04-28 DIAGNOSIS — Z1231 Encounter for screening mammogram for malignant neoplasm of breast: Secondary | ICD-10-CM | POA: Diagnosis not present

## 2018-04-30 DIAGNOSIS — M47816 Spondylosis without myelopathy or radiculopathy, lumbar region: Secondary | ICD-10-CM | POA: Diagnosis not present

## 2018-05-06 DIAGNOSIS — L565 Disseminated superficial actinic porokeratosis (DSAP): Secondary | ICD-10-CM | POA: Diagnosis not present

## 2018-05-06 DIAGNOSIS — C44712 Basal cell carcinoma of skin of right lower limb, including hip: Secondary | ICD-10-CM | POA: Diagnosis not present

## 2018-05-06 DIAGNOSIS — L821 Other seborrheic keratosis: Secondary | ICD-10-CM | POA: Diagnosis not present

## 2018-05-06 DIAGNOSIS — D485 Neoplasm of uncertain behavior of skin: Secondary | ICD-10-CM | POA: Diagnosis not present

## 2018-05-06 DIAGNOSIS — L2084 Intrinsic (allergic) eczema: Secondary | ICD-10-CM | POA: Diagnosis not present

## 2018-05-06 DIAGNOSIS — L570 Actinic keratosis: Secondary | ICD-10-CM | POA: Diagnosis not present

## 2018-06-26 DIAGNOSIS — Z23 Encounter for immunization: Secondary | ICD-10-CM | POA: Diagnosis not present

## 2018-07-07 DIAGNOSIS — C44712 Basal cell carcinoma of skin of right lower limb, including hip: Secondary | ICD-10-CM | POA: Diagnosis not present

## 2018-07-08 DIAGNOSIS — J3089 Other allergic rhinitis: Secondary | ICD-10-CM | POA: Diagnosis not present

## 2018-07-08 DIAGNOSIS — Z0000 Encounter for general adult medical examination without abnormal findings: Secondary | ICD-10-CM | POA: Diagnosis not present

## 2018-07-08 DIAGNOSIS — D473 Essential (hemorrhagic) thrombocythemia: Secondary | ICD-10-CM | POA: Diagnosis not present

## 2018-07-08 DIAGNOSIS — F419 Anxiety disorder, unspecified: Secondary | ICD-10-CM | POA: Diagnosis not present

## 2018-07-08 DIAGNOSIS — F5101 Primary insomnia: Secondary | ICD-10-CM | POA: Diagnosis not present

## 2018-07-08 DIAGNOSIS — I10 Essential (primary) hypertension: Secondary | ICD-10-CM | POA: Diagnosis not present

## 2018-07-10 DIAGNOSIS — D473 Essential (hemorrhagic) thrombocythemia: Secondary | ICD-10-CM | POA: Diagnosis not present

## 2018-07-10 DIAGNOSIS — Z9889 Other specified postprocedural states: Secondary | ICD-10-CM | POA: Diagnosis not present

## 2018-07-10 DIAGNOSIS — C4490 Unspecified malignant neoplasm of skin, unspecified: Secondary | ICD-10-CM | POA: Diagnosis not present

## 2018-07-10 DIAGNOSIS — Z79899 Other long term (current) drug therapy: Secondary | ICD-10-CM | POA: Diagnosis not present

## 2018-07-10 DIAGNOSIS — Z9089 Acquired absence of other organs: Secondary | ICD-10-CM | POA: Diagnosis not present

## 2018-07-10 DIAGNOSIS — I8390 Asymptomatic varicose veins of unspecified lower extremity: Secondary | ICD-10-CM | POA: Diagnosis not present

## 2018-08-04 DIAGNOSIS — Z85828 Personal history of other malignant neoplasm of skin: Secondary | ICD-10-CM | POA: Diagnosis not present

## 2018-08-04 DIAGNOSIS — L814 Other melanin hyperpigmentation: Secondary | ICD-10-CM | POA: Diagnosis not present

## 2018-08-13 DIAGNOSIS — D473 Essential (hemorrhagic) thrombocythemia: Secondary | ICD-10-CM | POA: Diagnosis not present

## 2018-09-10 DIAGNOSIS — D473 Essential (hemorrhagic) thrombocythemia: Secondary | ICD-10-CM | POA: Diagnosis not present

## 2018-09-12 IMAGING — US US PELVIS COMPLETE TRANSABD/TRANSVAG
1 series · 13 of 25 positions shown · non-contrast
Comparison: None

CLINICAL DATA: Initial evaluation for postmenopausal bleeding.

EXAM:
TRANSABDOMINAL AND TRANSVAGINAL ULTRASOUND OF PELVIS
TECHNIQUE: Both transabdominal and transvaginal ultrasound examinations of the
pelvis were performed. Transabdominal technique was performed for
global imaging of the pelvis including uterus, ovaries, adnexal
regions, and pelvic cul-de-sac. It was necessary to proceed with
endovaginal exam following the transabdominal exam to visualize the
uterus and ovaries.

[Series 1: us pelvis complete transabd/transvag · 0.22mm/px · 13 of 58 slices shown]
[im 1/58]
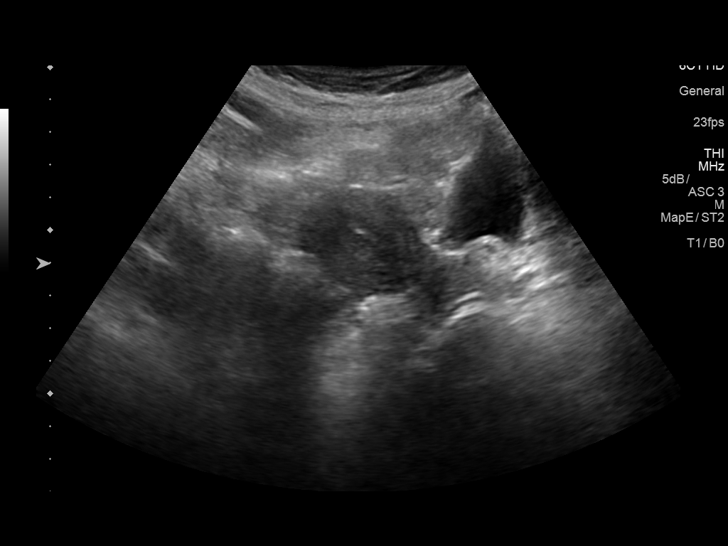
[im 5/58]
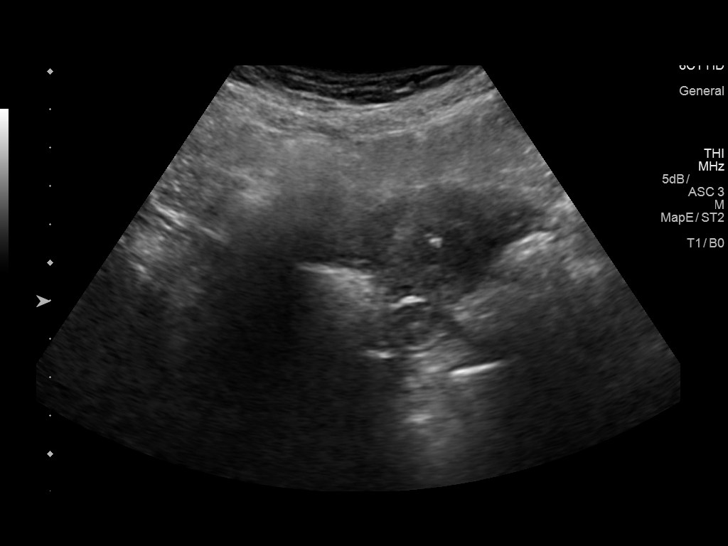
[im 10/58]
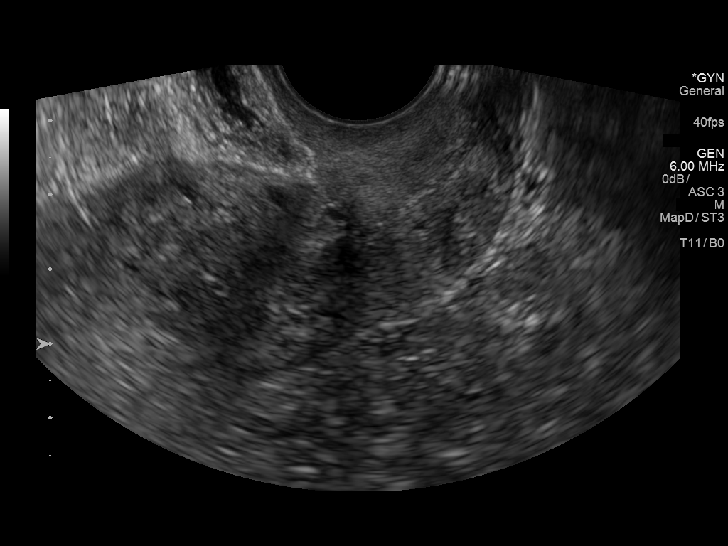
[im 15/58]
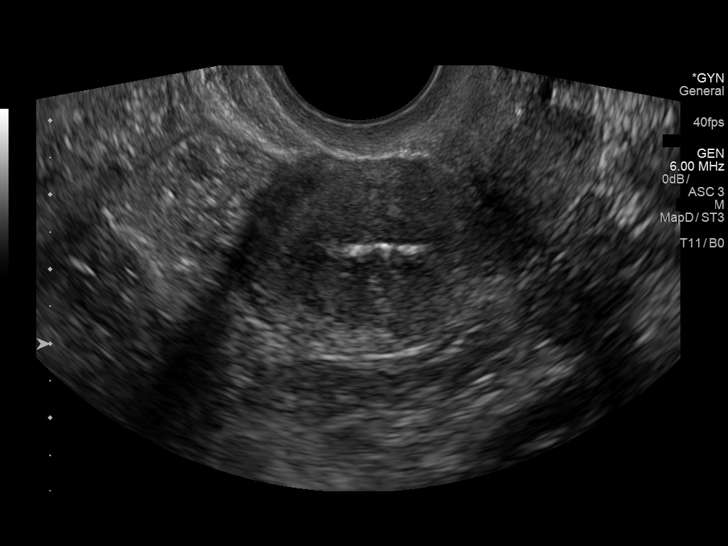
[im 20/58]
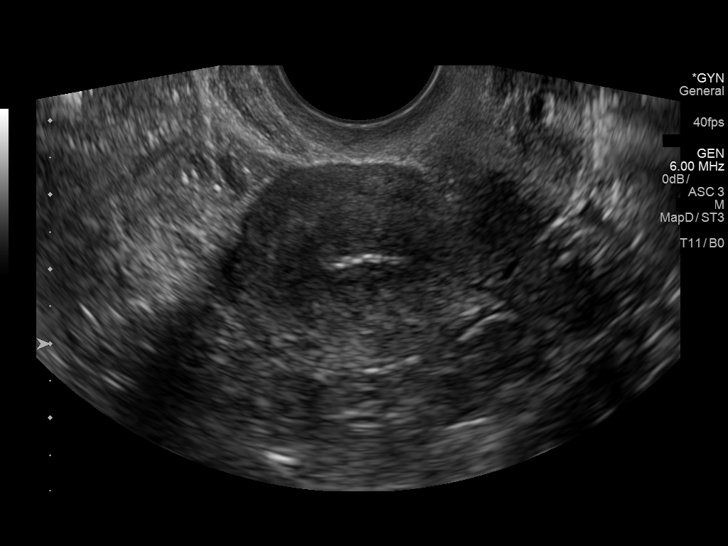
[im 24/58]
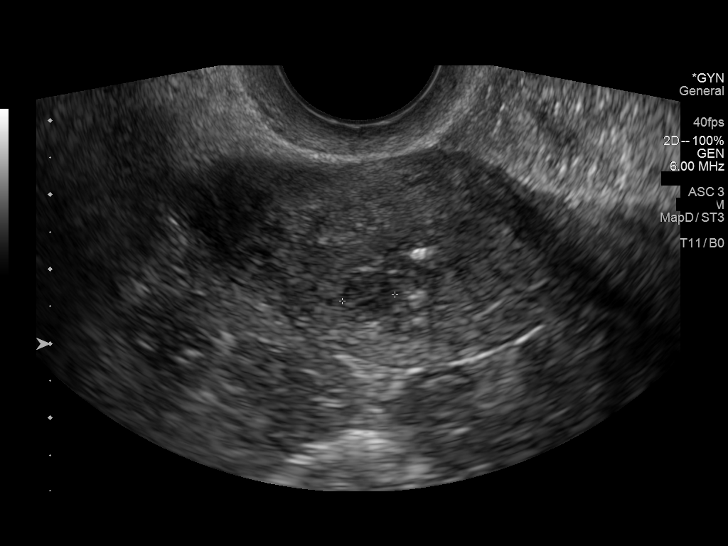
[im 29/58]
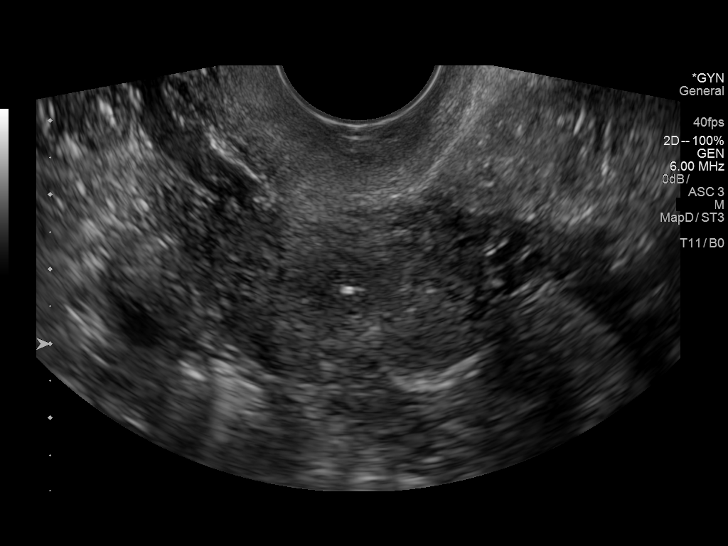
[im 34/58]
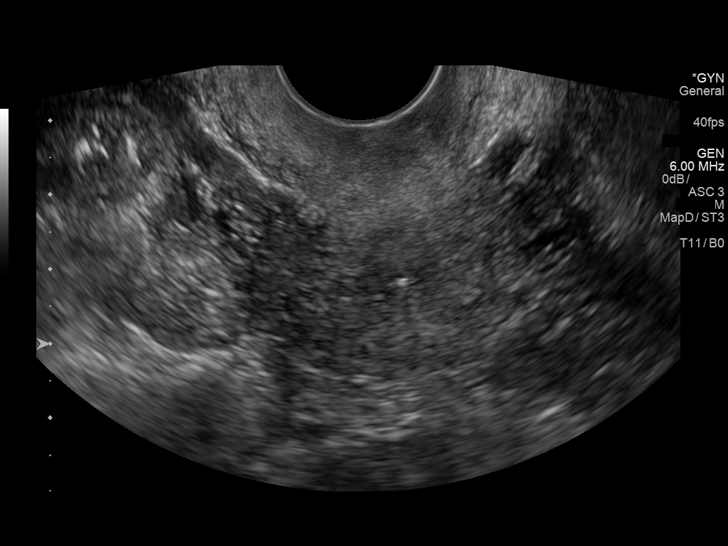
[im 39/58]
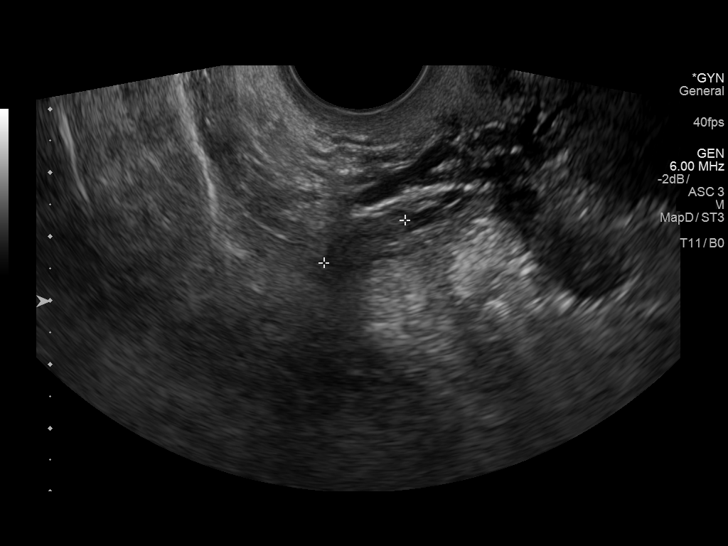
[im 43/58]
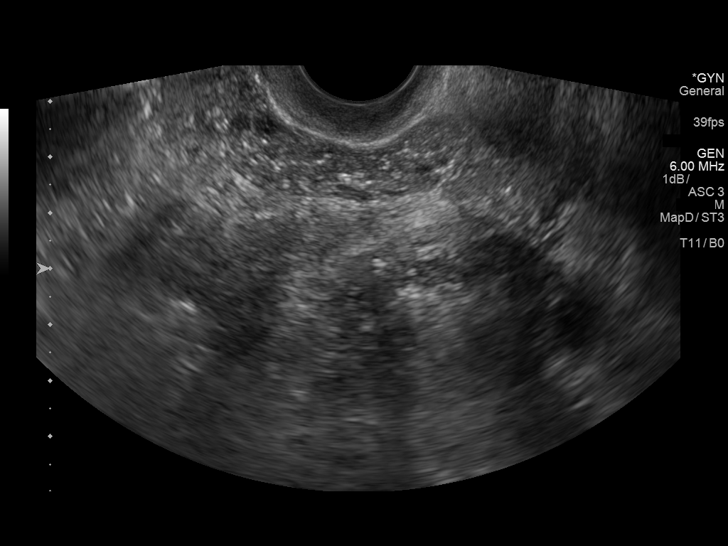
[im 48/58]
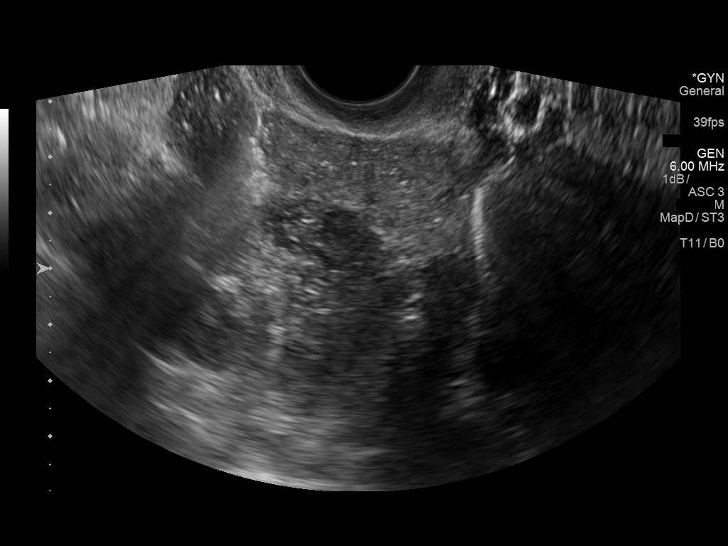
[im 53/58]
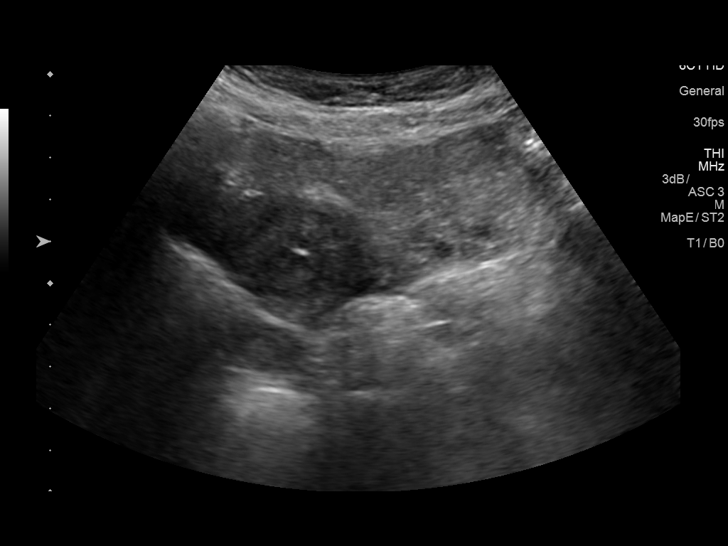
[im 58/58]
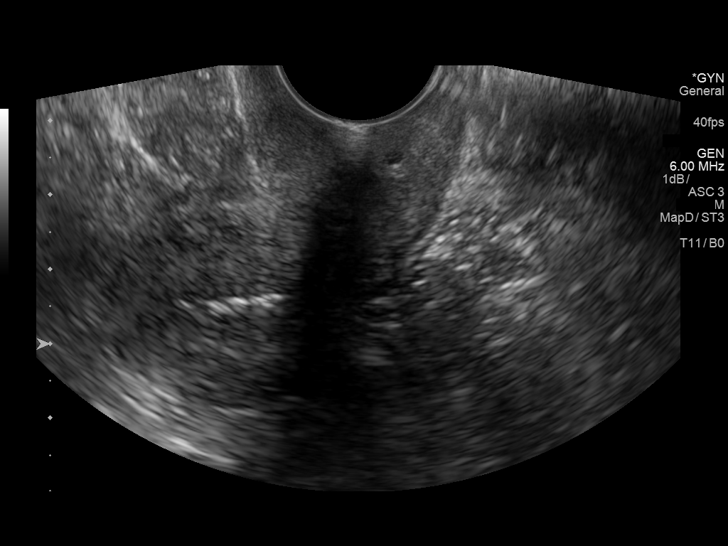

[13 of 25 positions shown; findings below may reference images not displayed]

FINDINGS: Uterus

Measurements: 5.5 x 2.8 x 4.1 cm. Small uterine fibroid measuring 9
x 8 x 7 mm seen within the right uterine body.

Endometrium

Thickness: 2.1 mm. Endometrium is diffusely echogenic without
discrete lesion or focal abnormality.

Right ovary

Measurements: 1.5 x 1.0 x 1.5 cm. Normal appearance/no adnexal mass.

Left ovary

Measurements: 1.3 x 1.5 x 1.6 cm. Normal appearance/no adnexal mass.

Other findings

No abnormal free fluid.
IMPRESSION: 1. Normal sonographic appearance of the endometrium. Endometrial
stripe measures 2.1 mm without focal abnormality.
2. Small uterine fibroid as above.
3. Normal sonographic appearance of the ovaries.  No adnexal mass.

## 2018-10-09 DIAGNOSIS — D473 Essential (hemorrhagic) thrombocythemia: Secondary | ICD-10-CM | POA: Diagnosis not present

## 2018-11-12 DIAGNOSIS — Z1211 Encounter for screening for malignant neoplasm of colon: Secondary | ICD-10-CM | POA: Diagnosis not present

## 2018-11-12 DIAGNOSIS — K5730 Diverticulosis of large intestine without perforation or abscess without bleeding: Secondary | ICD-10-CM | POA: Diagnosis not present

## 2018-11-12 DIAGNOSIS — Z86010 Personal history of colonic polyps: Secondary | ICD-10-CM | POA: Diagnosis not present

## 2018-11-12 DIAGNOSIS — K648 Other hemorrhoids: Secondary | ICD-10-CM | POA: Diagnosis not present

## 2018-11-12 DIAGNOSIS — K644 Residual hemorrhoidal skin tags: Secondary | ICD-10-CM | POA: Diagnosis not present

## 2018-11-12 DIAGNOSIS — Z8371 Family history of colonic polyps: Secondary | ICD-10-CM | POA: Diagnosis not present

## 2019-01-07 DIAGNOSIS — F5101 Primary insomnia: Secondary | ICD-10-CM | POA: Diagnosis not present

## 2019-01-07 DIAGNOSIS — I10 Essential (primary) hypertension: Secondary | ICD-10-CM | POA: Diagnosis not present

## 2019-01-07 DIAGNOSIS — F419 Anxiety disorder, unspecified: Secondary | ICD-10-CM | POA: Diagnosis not present

## 2019-01-08 DIAGNOSIS — D473 Essential (hemorrhagic) thrombocythemia: Secondary | ICD-10-CM | POA: Diagnosis not present

## 2019-02-04 DIAGNOSIS — H52223 Regular astigmatism, bilateral: Secondary | ICD-10-CM | POA: Diagnosis not present

## 2019-02-04 DIAGNOSIS — H35363 Drusen (degenerative) of macula, bilateral: Secondary | ICD-10-CM | POA: Diagnosis not present

## 2019-02-04 DIAGNOSIS — H524 Presbyopia: Secondary | ICD-10-CM | POA: Diagnosis not present

## 2019-02-04 DIAGNOSIS — H2513 Age-related nuclear cataract, bilateral: Secondary | ICD-10-CM | POA: Diagnosis not present

## 2019-02-04 DIAGNOSIS — H43813 Vitreous degeneration, bilateral: Secondary | ICD-10-CM | POA: Diagnosis not present

## 2019-02-04 DIAGNOSIS — H04123 Dry eye syndrome of bilateral lacrimal glands: Secondary | ICD-10-CM | POA: Diagnosis not present

## 2019-02-04 DIAGNOSIS — H5213 Myopia, bilateral: Secondary | ICD-10-CM | POA: Diagnosis not present

## 2019-03-18 DIAGNOSIS — L821 Other seborrheic keratosis: Secondary | ICD-10-CM | POA: Diagnosis not present

## 2019-03-18 DIAGNOSIS — L570 Actinic keratosis: Secondary | ICD-10-CM | POA: Diagnosis not present

## 2019-03-18 DIAGNOSIS — B078 Other viral warts: Secondary | ICD-10-CM | POA: Diagnosis not present

## 2019-03-18 DIAGNOSIS — Z85828 Personal history of other malignant neoplasm of skin: Secondary | ICD-10-CM | POA: Diagnosis not present

## 2019-03-18 DIAGNOSIS — D225 Melanocytic nevi of trunk: Secondary | ICD-10-CM | POA: Diagnosis not present

## 2019-03-18 DIAGNOSIS — L308 Other specified dermatitis: Secondary | ICD-10-CM | POA: Diagnosis not present

## 2019-03-18 DIAGNOSIS — D1801 Hemangioma of skin and subcutaneous tissue: Secondary | ICD-10-CM | POA: Diagnosis not present

## 2019-04-09 DIAGNOSIS — D473 Essential (hemorrhagic) thrombocythemia: Secondary | ICD-10-CM | POA: Diagnosis not present

## 2019-04-29 DIAGNOSIS — L570 Actinic keratosis: Secondary | ICD-10-CM | POA: Diagnosis not present

## 2019-05-04 DIAGNOSIS — Z1231 Encounter for screening mammogram for malignant neoplasm of breast: Secondary | ICD-10-CM | POA: Diagnosis not present

## 2019-05-20 DIAGNOSIS — R7309 Other abnormal glucose: Secondary | ICD-10-CM | POA: Diagnosis not present

## 2019-05-20 DIAGNOSIS — I10 Essential (primary) hypertension: Secondary | ICD-10-CM | POA: Diagnosis not present

## 2019-05-20 DIAGNOSIS — F5101 Primary insomnia: Secondary | ICD-10-CM | POA: Diagnosis not present

## 2019-05-20 DIAGNOSIS — F419 Anxiety disorder, unspecified: Secondary | ICD-10-CM | POA: Diagnosis not present

## 2019-05-20 DIAGNOSIS — E7800 Pure hypercholesterolemia, unspecified: Secondary | ICD-10-CM | POA: Diagnosis not present

## 2019-06-07 DIAGNOSIS — Z23 Encounter for immunization: Secondary | ICD-10-CM | POA: Diagnosis not present
